# Patient Record
Sex: Female | Born: 1985 | Race: Black or African American | Hispanic: No | Marital: Single | Smoking: Current every day smoker
Health system: Southern US, Community
[De-identification: ages and names within clinical notes are randomized; demographics above are authoritative.]

## PROBLEM LIST (undated history)

## (undated) DIAGNOSIS — J40 Bronchitis, not specified as acute or chronic: Secondary | ICD-10-CM

## (undated) DIAGNOSIS — M329 Systemic lupus erythematosus, unspecified: Secondary | ICD-10-CM

## (undated) DIAGNOSIS — M1990 Unspecified osteoarthritis, unspecified site: Secondary | ICD-10-CM

## (undated) DIAGNOSIS — IMO0002 Reserved for concepts with insufficient information to code with codable children: Secondary | ICD-10-CM

## (undated) HISTORY — PX: TUBAL LIGATION: SHX77

---

## 2010-03-13 ENCOUNTER — Emergency Department (HOSPITAL_BASED_OUTPATIENT_CLINIC_OR_DEPARTMENT_OTHER)
Admission: EM | Admit: 2010-03-13 | Discharge: 2010-03-13 | Disposition: A | Discharge status: Home / Self Care | Payer: 59 | Attending: Emergency Medicine | Admitting: Emergency Medicine

## 2010-03-13 DIAGNOSIS — J069 Acute upper respiratory infection, unspecified: Secondary | ICD-10-CM | POA: Insufficient documentation

## 2010-03-13 DIAGNOSIS — F17200 Nicotine dependence, unspecified, uncomplicated: Secondary | ICD-10-CM | POA: Insufficient documentation

## 2010-12-09 ENCOUNTER — Emergency Department (HOSPITAL_BASED_OUTPATIENT_CLINIC_OR_DEPARTMENT_OTHER)
Admission: EM | Admit: 2010-12-09 | Discharge: 2010-12-09 | Disposition: A | Discharge status: Home / Self Care | Payer: 59 | Attending: Emergency Medicine | Admitting: Emergency Medicine

## 2010-12-09 ENCOUNTER — Encounter: Payer: Self-pay | Admitting: *Deleted

## 2010-12-09 DIAGNOSIS — J40 Bronchitis, not specified as acute or chronic: Secondary | ICD-10-CM

## 2010-12-09 DIAGNOSIS — R059 Cough, unspecified: Secondary | ICD-10-CM | POA: Insufficient documentation

## 2010-12-09 DIAGNOSIS — Z79899 Other long term (current) drug therapy: Secondary | ICD-10-CM | POA: Insufficient documentation

## 2010-12-09 DIAGNOSIS — R05 Cough: Secondary | ICD-10-CM | POA: Insufficient documentation

## 2010-12-09 HISTORY — DX: Bronchitis, not specified as acute or chronic: J40

## 2010-12-09 MED ORDER — ALBUTEROL SULFATE (5 MG/ML) 0.5% IN NEBU
5.0000 mg | INHALATION_SOLUTION | Freq: Once | RESPIRATORY_TRACT | Status: AC
  Administered 2010-12-09: 5 mg via RESPIRATORY_TRACT
  Filled 2010-12-09: qty 1

## 2010-12-09 MED ORDER — PREDNISONE 10 MG PO TABS: 20.0000 mg | ORAL_TABLET | Freq: Every day | ORAL | Status: AC

## 2010-12-09 MED ORDER — ALBUTEROL SULFATE HFA 108 (90 BASE) MCG/ACT IN AERS
2.0000 | INHALATION_SPRAY | Freq: Once | RESPIRATORY_TRACT | Status: AC
  Administered 2010-12-09: 2 via RESPIRATORY_TRACT
  Filled 2010-12-09: qty 6.7

## 2010-12-09 MED ORDER — HYDROCOD POLST-CHLORPHEN POLST 10-8 MG/5ML PO LQCR: 5.0000 mL | Freq: Two times a day (BID) | ORAL | Status: DC | PRN

## 2010-12-09 NOTE — ED Notes (Signed)
Dr. Ghim at bedside. 

## 2010-12-09 NOTE — ED Provider Notes (Signed)
History     CSN: [id number] Arrival date & time: 12/09/2010  8:32 PM   First MD Initiated Contact with Patient 12/09/10 2049      Chief Complaint  Patient presents with  . Cough    (Consider location/radiation/quality/duration/timing/severity/associated sxs/prior treatment) HPI  Past Medical History  Diagnosis Date  . Bronchitis   . Asthma     History reviewed. No pertinent past surgical history.  No family history on file.  History  Substance Use Topics  . Smoking status: Current Everyday Smoker  . Smokeless tobacco: Not on file  . Alcohol Use: Yes     occasional    OB History    Grav Para Term Preterm Abortions TAB SAB Ect Mult Living                  Review of Systems  Allergies  Codeine  Home Medications   Current Outpatient Rx  Name Route Sig Dispense Refill  . DEXTROMETHORPHAN-GUAIFENESIN 10-200 MG PO CAPS Oral Take 2 capsules by mouth every 6 (six) hours as needed. For congestion     . ONE-DAILY MULTI VITAMINS PO TABS Oral Take 1 tablet by mouth daily.      Marland Kitchen HYDROCOD POLST-CHLORPHEN POLST 10-8 MG/5ML PO LQCR Oral Take 5 mLs by mouth every 12 (twelve) hours as needed. 80 mL 0  . PREDNISONE 10 MG PO TABS Oral Take 2 tablets (20 mg total) by mouth daily. 10 tablet 0    BP 149/85  Pulse 96  Temp(Src) 98.1 F (36.7 C) (Oral)  Resp 18  Ht 5\' 5"  (1.651 m)  Wt 170 lb (77.111 kg)  BMI 28.29 kg/m2  SpO2 100%  LMP 11/07/2010  Physical Exam  ED Course  Procedures (including critical care time)  Labs Reviewed - No data to display No results found.   1. Bronchitis       MDM       I personally performed the services described in this documentation, which was scribed in my presence. The recorded information has been reviewed and considered. Kendallyn Lippold Y.    Pt with minimal exp wheeze, paroxysmal cough in the room, not productive, no fever.  CP is reproducible and also worse with coughing which is her primary complaint she admits.   Will treat with tussionex, steroids, inhaled beta agonists for RAD.    Gavin Pound. Oletta Lamas, MD 12/09/10 2134

## 2010-12-09 NOTE — Discharge Instructions (Signed)
Bronchitis Bronchitis is the body's way of reacting to injury and/or infection (inflammation) of the bronchi. Bronchi are the air tubes that extend from the windpipe into the lungs. If the inflammation becomes severe, it may cause shortness of breath. CAUSES  Inflammation may be caused by:  A virus.   Germs (bacteria).   Dust.   Allergens.   Pollutants and many other irritants.  The cells lining the bronchial tree are covered with tiny hairs (cilia). These constantly beat upward, away from the lungs, toward the mouth. This keeps the lungs free of pollutants. When these cells become too irritated and are unable to do their job, mucus begins to develop. This causes the characteristic cough of bronchitis. The cough clears the lungs when the cilia are unable to do their job. Without either of these protective mechanisms, the mucus would settle in the lungs. Then you would develop pneumonia. Smoking is a common cause of bronchitis and can contribute to pneumonia. Stopping this habit is the single most important thing you can do to help yourself. TREATMENT   Your caregiver may prescribe an antibiotic if the cough is caused by bacteria. Also, medicines that open up your airways make it easier to breathe. Your caregiver may also recommend or prescribe an expectorant. It will loosen the mucus to be coughed up. Only take over-the-counter or prescription medicines for pain, discomfort, or fever as directed by your caregiver.   Removing whatever causes the problem (smoking, for example) is critical to preventing the problem from getting worse.   Cough suppressants may be prescribed for relief of cough symptoms.   Inhaled medicines may be prescribed to help with symptoms now and to help prevent problems from returning.   For those with recurrent (chronic) bronchitis, there may be a need for steroid medicines.  SEEK IMMEDIATE MEDICAL CARE IF:   During treatment, you develop more pus-like mucus  (purulent sputum).   You have a fever.   Your baby is older than 3 months with a rectal temperature of 102 F (38.9 C) or higher.   Your baby is 3 months old or younger with a rectal temperature of 100.4 F (38 C) or higher.   You become progressively more ill.   You have increased difficulty breathing, wheezing, or shortness of breath.  It is necessary to seek immediate medical care if you are elderly or sick from any other disease. MAKE SURE YOU:   Understand these instructions.   Will watch your condition.   Will get help right away if you are not doing well or get worse.  Document Released: 01/13/2005 Document Revised: 09/25/2010 Document Reviewed: 11/23/2007 ExitCare Patient Information 2012 [employer]. 

## 2010-12-09 NOTE — ED Notes (Signed)
HA, nasal congestion and coughing x2 weeks. Taking OTC meds without relief. States symptoms are worsening. States her home was recently found to have mold. Pt thinks she may be reacting to mold exposure.

## 2012-03-17 ENCOUNTER — Emergency Department (HOSPITAL_BASED_OUTPATIENT_CLINIC_OR_DEPARTMENT_OTHER)
Admission: EM | Admit: 2012-03-17 | Discharge: 2012-03-18 | Disposition: A | Discharge status: Home / Self Care | Payer: Worker's Compensation | Attending: Emergency Medicine | Admitting: Emergency Medicine

## 2012-03-17 ENCOUNTER — Encounter (HOSPITAL_BASED_OUTPATIENT_CLINIC_OR_DEPARTMENT_OTHER): Payer: Self-pay | Admitting: *Deleted

## 2012-03-17 DIAGNOSIS — S0502XA Injury of conjunctiva and corneal abrasion without foreign body, left eye, initial encounter: Secondary | ICD-10-CM

## 2012-03-17 DIAGNOSIS — S058X9A Other injuries of unspecified eye and orbit, initial encounter: Secondary | ICD-10-CM | POA: Insufficient documentation

## 2012-03-17 DIAGNOSIS — J45909 Unspecified asthma, uncomplicated: Secondary | ICD-10-CM | POA: Insufficient documentation

## 2012-03-17 DIAGNOSIS — Z8709 Personal history of other diseases of the respiratory system: Secondary | ICD-10-CM | POA: Insufficient documentation

## 2012-03-17 DIAGNOSIS — S0500XA Injury of conjunctiva and corneal abrasion without foreign body, unspecified eye, initial encounter: Secondary | ICD-10-CM | POA: Insufficient documentation

## 2012-03-17 DIAGNOSIS — IMO0002 Reserved for concepts with insufficient information to code with codable children: Secondary | ICD-10-CM | POA: Insufficient documentation

## 2012-03-17 DIAGNOSIS — F17200 Nicotine dependence, unspecified, uncomplicated: Secondary | ICD-10-CM | POA: Insufficient documentation

## 2012-03-17 DIAGNOSIS — Y929 Unspecified place or not applicable: Secondary | ICD-10-CM | POA: Insufficient documentation

## 2012-03-17 DIAGNOSIS — Y9389 Activity, other specified: Secondary | ICD-10-CM | POA: Insufficient documentation

## 2012-03-17 MED ORDER — PROPARACAINE HCL 0.5 % OP SOLN
1.0000 [drp] | Freq: Once | OPHTHALMIC | Status: AC
  Administered 2012-03-17: 1 [drp] via OPHTHALMIC
  Filled 2012-03-17: qty 15

## 2012-03-17 MED ORDER — FLUORESCEIN SODIUM 1 MG OP STRP
1.0000 | ORAL_STRIP | Freq: Once | OPHTHALMIC | Status: AC
  Administered 2012-03-17: 1 via OPHTHALMIC
  Filled 2012-03-17: qty 1

## 2012-03-17 NOTE — ED Notes (Signed)
Pt c/o left eye injury at work foreign body in eye

## 2012-03-18 MED ORDER — ERYTHROMYCIN 5 MG/GM OP OINT
TOPICAL_OINTMENT | Freq: Four times a day (QID) | OPHTHALMIC | Status: DC
  Administered 2012-03-18: 01:00:00 via OPHTHALMIC
  Filled 2012-03-18: qty 3.5

## 2012-03-18 NOTE — ED Notes (Signed)
MD at bedside. 

## 2012-03-18 NOTE — ED Provider Notes (Signed)
History     CSN: [id number]  Arrival date & time 03/17/12  2142   First MD Initiated Contact with Patient 03/18/12 0001      Chief Complaint  Patient presents with  . Eye Injury    (Consider location/radiation/quality/duration/timing/severity/associated sxs/prior treatment) HPI Carrie Christensen is a 27 y.o. female who was working this evening at [employer] and pulled something off of the shelves which released some debris into her eye. She had immediate sensation of a foreign body. She went to the bathroom and flush her eyes and had another 2-3 minutes of flushing her eye and an eye wash station. After that her eye felt better she no longer had the sensation of a foreign body although she still has a moderate, scratchy sensation in the inferior aspect of her left eye. She has no visual disturbances. Her vision is normal. Patient does not use contact lenses. Denies any other symptoms this evening, no shortness of breath, chest pain, abdominal pain, nausea vomiting or diarrhea, no headaches, fevers, chills myalgias or arthralgias. No rash.  Past Medical History  Diagnosis Date  . Bronchitis   . Asthma     History reviewed. No pertinent past surgical history.  History reviewed. No pertinent family history.  History  Substance Use Topics  . Smoking status: Current Every Day Smoker    Types: Cigars  . Smokeless tobacco: Not on file  . Alcohol Use: Yes     Comment: occasional    OB History   Grav Para Term Preterm Abortions TAB SAB Ect Mult Living                  Review of Systems At least 10pt or greater review of systems completed and are negative except where specified in the HPI.  Allergies  Codeine  Home Medications   Current Outpatient Rx  Name  Route  Sig  Dispense  Refill  . chlorpheniramine-HYDROcodone (TUSSIONEX PENNKINETIC ER) 10-8 MG/5ML LQCR   Oral   Take 5 mLs by mouth every 12 (twelve) hours as needed.   80 mL   0   . Dextromethorphan-Guaifenesin  (CORICIDIN HBP CONGESTION/COUGH) 10-200 MG CAPS   Oral   Take 2 capsules by mouth every 6 (six) hours as needed. For congestion          . Multiple Vitamin (MULTIVITAMIN) tablet   Oral   Take 1 tablet by mouth daily.             BP 142/84  Pulse 81  Temp(Src) 99.1 F (37.3 C) (Oral)  Resp 16  Ht 5\' 5"  (1.651 m)  Wt 170 lb (77.111 kg)  BMI 28.29 kg/m2  SpO2 100%  LMP 03/04/2012  Physical Exam  Eyes:      Nursing notes reviewed.  Electronic medical record reviewed. VITAL SIGNS:   Filed Vitals:   03/17/12 2149 03/17/12 2150  BP:  142/84  Pulse: 81   Temp: 99.1 F (37.3 C)   TempSrc: Oral   Resp: 16   Height: 5\' 5"  (1.651 m)   Weight: 170 lb (77.111 kg)   SpO2: 100%    CONSTITUTIONAL: Awake, oriented, appears non-toxic HENT: Atraumatic, normocephalic, oral mucosa pink and moist, airway patent. Nares patent without drainage. External ears normal. EYES: Conjunctiva clear in right eye, mildly injected left eye, EOMI, PERRLA. No foreign body seen with lid eversion of left eye upper and lower lids. NECK: Trachea midline, non-tender, supple CARDIOVASCULAR: Normal heart rate, Normal rhythm, No murmurs, rubs, gallops PULMONARY/CHEST:  Clear to auscultation, no rhonchi, wheezes, or rales. Symmetrical breath sounds. Non-tender. ABDOMINAL: Non-distended, soft, non-tender - no rebound or guarding.  BS normal. NEUROLOGIC: Non-focal, moving all four extremities, no gross sensory or motor deficits. EXTREMITIES: No clubbing, cyanosis, or edema SKIN: Warm, Dry, No erythema, No rash. Small pimple left posterior thigh junction with gluteus  ED Course  Procedures (including critical care time)  Labs Reviewed - No data to display No results found.   1. Conjunctival abrasion, left, initial encounter   2. Corneal abrasion, left, initial encounter       MDM  Carrie Christensen is a 27 y.o. female presents with a very small corneal abrasion and slightly larger conjunctival  abrasion of her left eye.  No foreign bodies are seen, patient's sensation of foreign body is completely gone and this happened after she flushed her eye out at work. Lids were everted and no foreign body was seen.  Patient's mild discomfort was relieved by proparacaine.  We'll discharge the patient home with erythromycin topical ophthalmic ointment.  She is urged to return to emergency Department if her eye she turned red for any visual disturbances, any fevers or any other concerning symptoms. Patient's questions have been answered, discharged home stable and in good condition.        Carrie Skene, MD 03/18/12 [phone]

## 2014-02-02 ENCOUNTER — Encounter (HOSPITAL_BASED_OUTPATIENT_CLINIC_OR_DEPARTMENT_OTHER): Payer: Self-pay

## 2014-02-02 ENCOUNTER — Emergency Department (HOSPITAL_BASED_OUTPATIENT_CLINIC_OR_DEPARTMENT_OTHER): Payer: 59

## 2014-02-02 ENCOUNTER — Emergency Department (HOSPITAL_BASED_OUTPATIENT_CLINIC_OR_DEPARTMENT_OTHER)
Admission: EM | Admit: 2014-02-02 | Discharge: 2014-02-03 | Disposition: A | Discharge status: Home / Self Care | Payer: 59 | Attending: Emergency Medicine | Admitting: Emergency Medicine

## 2014-02-02 DIAGNOSIS — J45901 Unspecified asthma with (acute) exacerbation: Secondary | ICD-10-CM | POA: Insufficient documentation

## 2014-02-02 DIAGNOSIS — Z87891 Personal history of nicotine dependence: Secondary | ICD-10-CM | POA: Diagnosis not present

## 2014-02-02 DIAGNOSIS — Z3A01 Less than 8 weeks gestation of pregnancy: Secondary | ICD-10-CM | POA: Diagnosis not present

## 2014-02-02 DIAGNOSIS — O039 Complete or unspecified spontaneous abortion without complication: Secondary | ICD-10-CM | POA: Diagnosis not present

## 2014-02-02 DIAGNOSIS — O4690 Antepartum hemorrhage, unspecified, unspecified trimester: Secondary | ICD-10-CM

## 2014-02-02 DIAGNOSIS — O99511 Diseases of the respiratory system complicating pregnancy, first trimester: Secondary | ICD-10-CM | POA: Diagnosis not present

## 2014-02-02 DIAGNOSIS — O209 Hemorrhage in early pregnancy, unspecified: Secondary | ICD-10-CM | POA: Diagnosis present

## 2014-02-02 DIAGNOSIS — R58 Hemorrhage, not elsewhere classified: Secondary | ICD-10-CM

## 2014-02-02 LAB — HCG, QUANTITATIVE, PREGNANCY: hCG, Beta Chain, Quant, S: 3512 m[IU]/mL — ABNORMAL HIGH (ref <5)

## 2014-02-02 LAB — WET PREP, GENITAL
CLUE CELLS WET PREP: NONE SEEN
TRICH WET PREP: NONE SEEN
WBC WET PREP: NONE SEEN
Yeast Wet Prep HPF POC: NONE SEEN

## 2014-02-02 NOTE — ED Provider Notes (Signed)
On reassessment the patient is awake and alert, interacting appropriately. Patient states that she cannot wait for her test results. She does state that she will follow her obstetrician tomorrow to confirm her Rh status. She was made aware of all risks of leaving prematurely, voiced an understanding of all of these risks, but continued to depart.   Gerhard Munchobert Riddick Nuon, MD 02/02/14 [phone]

## 2014-02-02 NOTE — ED Notes (Signed)
C/o vaginal bleed x 30 min-1 pad in place-pt estimates she is 3 mos pregnant- LMP 10/15-did not keep 12/27 UD appt-G4 P3

## 2014-02-02 NOTE — Discharge Instructions (Signed)
Miscarriage A miscarriage is the sudden loss of an unborn baby (fetus) before the 20th week of pregnancy. Most miscarriages happen in the first 3 months of pregnancy. Sometimes, it happens before a woman even knows she is pregnant. A miscarriage is also called a "spontaneous miscarriage" or "early pregnancy loss." Having a miscarriage can be an emotional experience. Talk with your caregiver about any questions you may have about miscarrying, the grieving process, and your future pregnancy plans. CAUSES   Problems with the fetal chromosomes that make it impossible for the baby to develop normally. Problems with the baby's genes or chromosomes are most often the result of errors that occur, by chance, as the embryo divides and grows. The problems are not inherited from the parents.  Infection of the cervix or uterus.   Hormone problems.   Problems with the cervix, such as having an incompetent cervix. This is when the tissue in the cervix is not strong enough to hold the pregnancy.   Problems with the uterus, such as an abnormally shaped uterus, uterine fibroids, or congenital abnormalities.   Certain medical conditions.   Smoking, drinking alcohol, or taking illegal drugs.   Trauma.  Often, the cause of a miscarriage is unknown.  SYMPTOMS   Vaginal bleeding or spotting, with or without cramps or pain.  Pain or cramping in the abdomen or lower back.  Passing fluid, tissue, or blood clots from the vagina. DIAGNOSIS  Your caregiver will perform a physical exam. You may also have an ultrasound to confirm the miscarriage. Blood or urine tests may also be ordered. TREATMENT   Sometimes, treatment is not necessary if you naturally pass all the fetal tissue that was in the uterus. If some of the fetus or placenta remains in the body (incomplete miscarriage), tissue left behind may become infected and must be removed. Usually, a dilation and curettage (D and C) procedure is performed.  During a D and C procedure, the cervix is widened (dilated) and any remaining fetal or placental tissue is gently removed from the uterus.  Antibiotic medicines are prescribed if there is an infection. Other medicines may be given to reduce the size of the uterus (contract) if there is a lot of bleeding.  If you have Rh negative blood and your baby was Rh positive, you will need a Rh immunoglobulin shot. This shot will protect any future baby from having Rh blood problems in future pregnancies. HOME CARE INSTRUCTIONS   Your caregiver may order bed rest or may allow you to continue light activity. Resume activity as directed by your caregiver.  Have someone help with home and family responsibilities during this time.   Keep track of the number of sanitary pads you use each day and how soaked (saturated) they are. Write down this information.   Do not use tampons. Do not douche or have sexual intercourse until approved by your caregiver.   Only take over-the-counter or prescription medicines for pain or discomfort as directed by your caregiver.   Do not take aspirin. Aspirin can cause bleeding.   Keep all follow-up appointments with your caregiver.   If you or your partner have problems with grieving, talk to your caregiver or seek counseling to help cope with the pregnancy loss. Allow enough time to grieve before trying to get pregnant again.  SEEK IMMEDIATE MEDICAL CARE IF:   You have severe cramps or pain in your back or abdomen.  You have a fever.  You pass large blood clots (walnut-sized   or larger) ortissue from your vagina. Save any tissue for your caregiver to inspect.   Your bleeding increases.   You have a thick, bad-smelling vaginal discharge.  You become lightheaded, weak, or you faint.   You have chills.  MAKE SURE YOU:  Understand these instructions.  Will watch your condition.  Will get help right away if you are not doing well or get  worse. Document Released: 07/09/2000 Document Revised: 05/10/2012 Document Reviewed: 03/04/2011 ExitCare Patient Information 2015 [employer]. This information is not intended to replace advice given to you by your health care provider. Make sure you discuss any questions you have with your health care provider.  

## 2014-02-02 NOTE — ED Provider Notes (Signed)
CSN: [id number]     Arrival date & time 02/02/14  1943 History   First MD Initiated Contact with Patient 02/02/14 2019     Chief Complaint  Patient presents with  . Pregnant   . Vaginal Bleeding     (Consider location/radiation/quality/duration/timing/severity/associated sxs/prior Treatment) Patient is a 29 y.o. female presenting with vaginal bleeding. The history is provided by the patient. No language interpreter was used.  Vaginal Bleeding Quality:  Bright red and clots Severity:  Moderate Onset quality:  Gradual Duration:  1 day Timing:  Constant Progression:  Worsening Chronicity:  New Menstrual history:  Regular Number of pads used:  1 Possible pregnancy: yes   Relieved by:  Nothing Worsened by:  Nothing tried Ineffective treatments:  None tried Risk factors: no bleeding disorder   Pt is early pregnant.  Pt reports she began having bleeding today Pt taking prenatal vitamins.   Pt has not had first prenatal app.  Pt is a [id number]   She is followed by Dr. Delford FieldWright in [place].   Past Medical History  Diagnosis Date  . Bronchitis   . Asthma    History reviewed. No pertinent past surgical history. No family history on file. History  Substance Use Topics  . Smoking status: Former Games developermoker  . Smokeless tobacco: Not on file  . Alcohol Use: No   OB History    Gravida Para Term Preterm AB TAB SAB Ectopic Multiple Living   1              Review of Systems  Genitourinary: Positive for vaginal bleeding.  All other systems reviewed and are negative.     Allergies  Codeine  Home Medications   Prior to Admission medications   Not on File   BP 139/82 mmHg  Pulse 89  Temp(Src) 98.9 F (37.2 C)  Resp 18  Ht 5\' 5"  (1.651 m)  Wt 180 lb (81.647 kg)  BMI 29.95 kg/m2  SpO2 100%  LMP 11/10/2013 Physical Exam  Constitutional: She is oriented to person, place, and time. She appears well-developed and well-nourished.  HENT:  Head: Normocephalic.  Right Ear: External  ear normal.  Left Ear: External ear normal.  Nose: Nose normal.  Mouth/Throat: Oropharynx is clear and moist.  Eyes: EOM are normal.  Neck: Normal range of motion.  Cardiovascular: Normal rate and normal heart sounds.   Pulmonary/Chest: Effort normal.  Abdominal: Soft. She exhibits no distension.  Genitourinary: Uterus normal.  Large clots,   Cervical os closed,    Musculoskeletal: Normal range of motion.  Neurological: She is alert and oriented to person, place, and time.  Skin: Skin is warm.  Psychiatric: She has a normal mood and affect.  Nursing note and vitals reviewed.   ED Course  Procedures (including critical care time) Labs Review Labs Reviewed  HCG, QUANTITATIVE, PREGNANCY - Abnormal; Notable for the following:    hCG, Beta Chain, Quant, S 3512 (*)    All other components within normal limits  WET PREP, GENITAL  GC/CHLAMYDIA PROBE AMP  ABO/RH    Imaging Review [place] Ob Comp Less 14 Wks  02/02/2014   CLINICAL DATA:  Pregnant patient with vaginal bleeding in early pregnancy.  EXAM: OBSTETRIC <14 WK US AND TRANSVAGINAL OB US  TECHNIQUE: Both transabdominal and transvaginal ultrasound examinations were performed for complete evaluation of the gestation as well as the maternal uterus, adnexal regions, and pelvic cul-de-sac. Transvaginal technique was performed to assess early pregnancy.  COMPARISON:  None.  FINDINGS:  Intrauterine gestational sac: Visualized. The gestational sac appears low in the endometrial canal/lower uterine segment and irregular in shape. There is moderate subchorionic hemorrhage.  Yolk sac:  Not present.  Embryo:  Not present.  Cardiac Activity: Not seen.  MSD:  20 mm   6 w   6  d  Maternal uterus/adnexae: The right and left ovary are normal with normal blood flow. There is no adnexal mass. No pelvic free fluid.  IMPRESSION: Intrauterine gestational sac that is irregular in shape and low in position in the endometrial canal/ lower uterine segment. No fetal pole  or yolk sac. Findings are suspicious but not yet definitive for failed pregnancy. Recommend follow-up US in 10-14 days for definitive diagnosis. This recommendation follows SRU consensus guidelines: Diagnostic Criteria for Nonviable Pregnancy Early in the First Trimester. Malva Limes Med 2013; 161:0960-45.   Electronically Signed   By: Rubye Oaks M.D.   On: 02/02/2014 21:55   US Ob Transvaginal  02/02/2014   CLINICAL DATA:  Pregnant patient with vaginal bleeding in early pregnancy.  EXAM: OBSTETRIC <14 WK [place] AND TRANSVAGINAL OB US  TECHNIQUE: Both transabdominal and transvaginal ultrasound examinations were performed for complete evaluation of the gestation as well as the maternal uterus, adnexal regions, and pelvic cul-de-sac. Transvaginal technique was performed to assess early pregnancy.  COMPARISON:  None.  FINDINGS: Intrauterine gestational sac: Visualized. The gestational sac appears low in the endometrial canal/lower uterine segment and irregular in shape. There is moderate subchorionic hemorrhage.  Yolk sac:  Not present.  Embryo:  Not present.  Cardiac Activity: Not seen.  MSD:  20 mm   6 w   6  d  Maternal uterus/adnexae: The right and left ovary are normal with normal blood flow. There is no adnexal mass. No pelvic free fluid.  IMPRESSION: Intrauterine gestational sac that is irregular in shape and low in position in the endometrial canal/ lower uterine segment. No fetal pole or yolk sac. Findings are suspicious but not yet definitive for failed pregnancy. Recommend follow-up US in 10-14 days for definitive diagnosis. This recommendation follows SRU consensus guidelines: Diagnostic Criteria for Nonviable Pregnancy Early in the First Trimester. Malva Limes Med 2013; 409:8119-14.   Electronically Signed   By: Rubye Oaks M.D.   On: 02/02/2014 21:55     EKG Interpretation None      MDM  Impending SAB,  Pt counseled on symptoms.     Final diagnoses:  Bleeding  Spontaneous abortion         Elson Areas, PA-C 02/04/14 1116  Gerhard Munch, MD 02/04/14 2242

## 2014-02-03 LAB — ABO/RH: ABO/RH(D): A POS

## 2014-02-04 LAB — GC/CHLAMYDIA PROBE AMP
CT PROBE, AMP APTIMA: NEGATIVE
GC PROBE AMP APTIMA: NEGATIVE

## 2015-10-13 ENCOUNTER — Emergency Department (HOSPITAL_BASED_OUTPATIENT_CLINIC_OR_DEPARTMENT_OTHER)
Admission: EM | Admit: 2015-10-13 | Discharge: 2015-10-13 | Disposition: A | Discharge status: Home / Self Care | Payer: No Typology Code available for payment source | Attending: Emergency Medicine | Admitting: Emergency Medicine

## 2015-10-13 ENCOUNTER — Encounter (HOSPITAL_BASED_OUTPATIENT_CLINIC_OR_DEPARTMENT_OTHER): Payer: Self-pay | Admitting: Emergency Medicine

## 2015-10-13 ENCOUNTER — Emergency Department (HOSPITAL_BASED_OUTPATIENT_CLINIC_OR_DEPARTMENT_OTHER): Payer: No Typology Code available for payment source

## 2015-10-13 DIAGNOSIS — J45909 Unspecified asthma, uncomplicated: Secondary | ICD-10-CM | POA: Insufficient documentation

## 2015-10-13 DIAGNOSIS — Y999 Unspecified external cause status: Secondary | ICD-10-CM | POA: Diagnosis not present

## 2015-10-13 DIAGNOSIS — F17290 Nicotine dependence, other tobacco product, uncomplicated: Secondary | ICD-10-CM | POA: Insufficient documentation

## 2015-10-13 DIAGNOSIS — Z79899 Other long term (current) drug therapy: Secondary | ICD-10-CM | POA: Diagnosis not present

## 2015-10-13 DIAGNOSIS — M549 Dorsalgia, unspecified: Secondary | ICD-10-CM | POA: Insufficient documentation

## 2015-10-13 DIAGNOSIS — S161XXA Strain of muscle, fascia and tendon at neck level, initial encounter: Secondary | ICD-10-CM | POA: Diagnosis not present

## 2015-10-13 DIAGNOSIS — Y9389 Activity, other specified: Secondary | ICD-10-CM | POA: Insufficient documentation

## 2015-10-13 DIAGNOSIS — Y92410 Unspecified street and highway as the place of occurrence of the external cause: Secondary | ICD-10-CM | POA: Insufficient documentation

## 2015-10-13 DIAGNOSIS — S199XXA Unspecified injury of neck, initial encounter: Secondary | ICD-10-CM | POA: Diagnosis present

## 2015-10-13 HISTORY — DX: Unspecified osteoarthritis, unspecified site: M19.90

## 2015-10-13 NOTE — ED Notes (Signed)
MD at bedside. 

## 2015-10-13 NOTE — ED Notes (Signed)
Pt given d/c instructions as per chart. Verbalizes understanding. No questions. 

## 2015-10-13 NOTE — Discharge Instructions (Signed)
You can take your tramadol as needed for pain. If tramadol does not adequately manage her pain, you can supplement with Tylenol as directed. Contact your primary care physician at [facility] if having significant pain or if pain not well controlled by next week. Return if concerned for any reason.
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
## 2017-12-28 ENCOUNTER — Emergency Department (HOSPITAL_BASED_OUTPATIENT_CLINIC_OR_DEPARTMENT_OTHER)
Admission: EM | Admit: 2017-12-28 | Discharge: 2017-12-28 | Disposition: A | Discharge status: Home / Self Care | Payer: 59 | Attending: Emergency Medicine | Admitting: Emergency Medicine

## 2017-12-28 ENCOUNTER — Emergency Department (HOSPITAL_BASED_OUTPATIENT_CLINIC_OR_DEPARTMENT_OTHER): Payer: 59

## 2017-12-28 ENCOUNTER — Encounter (HOSPITAL_BASED_OUTPATIENT_CLINIC_OR_DEPARTMENT_OTHER): Payer: Self-pay | Admitting: *Deleted

## 2017-12-28 ENCOUNTER — Other Ambulatory Visit: Payer: Self-pay

## 2017-12-28 DIAGNOSIS — Z79899 Other long term (current) drug therapy: Secondary | ICD-10-CM | POA: Diagnosis not present

## 2017-12-28 DIAGNOSIS — J45909 Unspecified asthma, uncomplicated: Secondary | ICD-10-CM | POA: Insufficient documentation

## 2017-12-28 DIAGNOSIS — F17210 Nicotine dependence, cigarettes, uncomplicated: Secondary | ICD-10-CM | POA: Diagnosis not present

## 2017-12-28 DIAGNOSIS — J111 Influenza due to unidentified influenza virus with other respiratory manifestations: Secondary | ICD-10-CM | POA: Diagnosis not present

## 2017-12-28 DIAGNOSIS — R69 Illness, unspecified: Secondary | ICD-10-CM

## 2017-12-28 DIAGNOSIS — R05 Cough: Secondary | ICD-10-CM | POA: Diagnosis present

## 2017-12-28 HISTORY — DX: Reserved for concepts with insufficient information to code with codable children: IMO0002

## 2017-12-28 HISTORY — DX: Systemic lupus erythematosus, unspecified: M32.9

## 2017-12-28 MED ORDER — HYDROCOD POLST-CPM POLST ER 10-8 MG/5ML PO SUER
5.0000 mL | Freq: Once | ORAL | Status: AC
  Administered 2017-12-28: 5 mL via ORAL
  Filled 2017-12-28: qty 5

## 2017-12-28 MED ORDER — BENZONATATE 100 MG PO CAPS: 100.0000 mg | ORAL_CAPSULE | Freq: Three times a day (TID) | ORAL | 0 refills | Status: AC

## 2017-12-28 MED ORDER — NAPROXEN 500 MG PO TABS: 500.0000 mg | ORAL_TABLET | Freq: Two times a day (BID) | ORAL | 0 refills | Status: AC

## 2017-12-28 MED ORDER — KETOROLAC TROMETHAMINE 15 MG/ML IJ SOLN
15.0000 mg | Freq: Once | INTRAMUSCULAR | Status: AC
  Administered 2017-12-28: 15 mg via INTRAMUSCULAR
  Filled 2017-12-28: qty 1

## 2017-12-28 MED ORDER — ALBUTEROL SULFATE HFA 108 (90 BASE) MCG/ACT IN AERS
2.0000 | INHALATION_SPRAY | Freq: Once | RESPIRATORY_TRACT | Status: AC
  Administered 2017-12-28: 2 via RESPIRATORY_TRACT
  Filled 2017-12-28: qty 6.7

## 2017-12-28 NOTE — Discharge Instructions (Signed)
Please read and follow all provided instructions.  Your diagnoses today include:  1. Influenza-like illness     Tests performed today include:  Vital signs. See below for your results today.   Medications prescribed:   Tessalon Perles - cough suppressant medication   Naproxen - anti-inflammatory pain medication  Do not exceed 500mg  naproxen every 12 hours, take with food  You have been prescribed an anti-inflammatory medication or NSAID. Take with food. Take smallest effective dose for the shortest duration needed for your pain. Stop taking if you experience stomach pain or vomiting.    Albuterol inhaler - medication that opens up your airway  Use inhaler as follows: 1-2 puffs every 4 hours as needed for wheezing, cough, or shortness of breath.   Take any prescribed medications only as directed.  Home care instructions:  Follow any educational materials contained in this packet. Please continue drinking plenty of fluids. Use over-the-counter cold and flu medications as needed as directed on packaging for symptom relief. You may also use ibuprofen or tylenol as directed on packaging for pain or fever.   BE VERY CAREFUL not to take multiple medicines containing Tylenol (also called acetaminophen). Doing so can lead to an overdose which can damage your liver and cause liver failure and possibly death.   Follow-up instructions: Please follow-up with your primary care provider in the next 3 days for further evaluation of your symptoms.   Return instructions:   Please return to the Emergency Department if you experience worsening symptoms.  Please return if you have a high fever greater than 101 degrees not controlled with over-the-counter medications, persistent vomiting and cannot keep down fluids, or worsening trouble breathing.  Please return if you have any other emergent concerns.  Additional Information:  Your vital signs today were: BP (!) 123/105    Pulse (!) 120     Temp 99.9 F (37.7 C) (Oral)    Resp 20    Ht 5\' 5"  (1.651 m)    Wt 81.6 kg    LMP 12/07/2017    SpO2 97%    BMI 29.95 kg/m  If your blood pressure (BP) was elevated above 135/85 this visit, please have this repeated by your doctor within one month.

## 2017-12-28 NOTE — ED Provider Notes (Signed)
[facility] EMERGENCY DEPARTMENT Provider Note   CSN: [id number] Arrival date & time: 12/28/17  1713     History   Chief Complaint Chief Complaint  Patient presents with  . Cough    HPI Carrie Christensen is a 32 y.o. female.  Patient presents to the emergency department today with 2 days of flulike symptoms.  She has had a nonproductive cough, fever to 100.5 F at home, body aches, sore throat, nasal congestion and runny nose.  She has pain throughout her chest when she coughs.  She has been using over-the-counter medications with mixed results.  She denies any nausea, vomiting, or diarrhea.  No known sick contacts.  History of asthma but she has not been wheezing.  States that she is currently out of her inhaler.     Past Medical History:  Diagnosis Date  . Arthritis   . Asthma   . Bronchitis   . Lupus (HCC)     There are no active problems to display for this patient.   Past Surgical History:  Procedure Laterality Date  . TUBAL LIGATION       OB History    Gravida  1   Para      Term      Preterm      AB      Living        SAB      TAB      Ectopic      Multiple      Live Births               Home Medications    Prior to Admission medications   Medication Sig Start Date End Date Taking? Authorizing Provider  GABAPENTIN PO Take by mouth.   Yes [provider]  benzonatate (TESSALON) 100 MG capsule Take 1 capsule (100 mg total) by mouth every 8 (eight) hours. 12/28/17   Renne CriglerGeiple, Keyanni Whittinghill, PA-C  esomeprazole (NEXIUM) 20 MG capsule Take 20 mg by mouth daily at 12 noon.    [provider]  naproxen (NAPROSYN) 500 MG tablet Take 1 tablet (500 mg total) by mouth 2 (two) times daily. 12/28/17   Renne CriglerGeiple, Le[place] Phifer, PA-C  ondansetron (ZOFRAN) 4 MG tablet Take 4 mg by mouth every 8 (eight) hours as needed for nausea or vomiting.    [provider]    Family History No family history on file.  Social History Social  History   Tobacco Use  . Smoking status: Current Every Day Smoker    Types: Cigars  . Smokeless tobacco: Never Used  Substance Use Topics  . Alcohol use: No  . Drug use: No     Allergies   Codeine   Review of Systems Review of Systems  Constitutional: Positive for chills, fatigue and fever.  HENT: Positive for congestion and sore throat. Negative for ear pain, rhinorrhea and sinus pressure.   Eyes: Negative for redness.  Respiratory: Positive for cough. Negative for shortness of breath and wheezing.   Gastrointestinal: Negative for abdominal pain, diarrhea, nausea and vomiting.  Genitourinary: Negative for dysuria.  Musculoskeletal: Positive for myalgias. Negative for neck stiffness.  Skin: Negative for rash.  Neurological: Negative for headaches.  Hematological: Negative for adenopathy.     Physical Exam Updated Vital Signs BP (!) 147/93 (BP Location: Right Arm)   Pulse (!) 101   Temp 100.1 F (37.8 C) (Oral)   Resp 20   Ht 5\' 5"  (1.651 m)   Wt 81.6  kg   LMP 12/07/2017   SpO2 100%   BMI 29.95 kg/m   Physical Exam  Constitutional: She appears well-developed and well-nourished.  HENT:  Head: Normocephalic and atraumatic.  Right Ear: Tympanic membrane, external ear and ear canal normal.  Left Ear: Tympanic membrane, external ear and ear canal normal.  Nose: Mucosal edema present. No rhinorrhea.  Mouth/Throat: Uvula is midline, oropharynx is clear and moist and mucous membranes are normal. Mucous membranes are not dry. No oral lesions. No trismus in the jaw. No uvula swelling. No oropharyngeal exudate, posterior oropharyngeal edema, posterior oropharyngeal erythema or tonsillar abscesses.  Eyes: Conjunctivae are normal. Right eye exhibits no discharge. Left eye exhibits no discharge.  Neck: Normal range of motion. Neck supple.  Cardiovascular: Regular rhythm and normal heart sounds. Tachycardia present.  Mild tachy  Pulmonary/Chest: Effort normal and breath  sounds normal. No respiratory distress. She has no wheezes. She has no rales.  Active cough during exam  Abdominal: Soft. There is no tenderness.  Lymphadenopathy:    She has no cervical adenopathy.  Neurological: She is alert.  Skin: Skin is warm and dry.  Psychiatric: She has a normal mood and affect.  Nursing note and vitals reviewed.    ED Treatments / Results  Labs (all labs ordered are listed, but only abnormal results are displayed) Labs Reviewed - No data to display  EKG None  Radiology Dg Chest 2 View  Result Date: 12/28/2017 CLINICAL DATA:  Cough, fever and body aches EXAM: CHEST - 2 VIEW COMPARISON:  03/19/2017 FINDINGS: The heart size and mediastinal contours are within normal limits. Both lungs are clear. The visualized skeletal structures are unremarkable. IMPRESSION: No active cardiopulmonary disease. Electronically Signed   By: Signa Kell M.D.   On: 12/28/2017 18:35    Procedures Procedures (including critical care time)  Medications Ordered in ED Medications  chlorpheniramine-HYDROcodone (TUSSIONEX) 10-8 MG/5ML suspension 5 mL (has no administration in time range)  ketorolac (TORADOL) 15 MG/ML injection 15 mg (has no administration in time range)  albuterol (PROVENTIL HFA;VENTOLIN HFA) 108 (90 Base) MCG/ACT inhaler 2 puff (has no administration in time range)     Initial Impression / Assessment and Plan / ED Course  I have reviewed the triage vital signs and the nursing notes.  Pertinent labs & imaging results that were available during my care of the patient were reviewed by me and considered in my medical decision making (see chart for details).     Patient seen and examined. Work-up initiated. Medications ordered.   Vital signs reviewed and are as follows: BP (!) 147/93 (BP Location: Right Arm)   Pulse (!) 101   Temp 100.1 F (37.8 C) (Oral)   Resp 20   Ht 5\' 5"  (1.651 m)   Wt 81.6 kg   LMP 12/07/2017   SpO2 100%   BMI 29.95 kg/m   We  will treat here with IM Toradol, dose of Tussionex, albuterol inhaler.  Patient discharged to home. Encouraged to rest and drink plenty of fluids.  Patient told to return to ED or see their primary doctor if their symptoms worsen, high fever not controlled with tylenol, persistent vomiting, they feel they are dehydrated, or if they have any other concerns.  Patient verbalized understanding and agreed with plan.     Final Clinical Impressions(s) / ED Diagnoses   Final diagnoses:  Influenza-like illness   Patient with symptoms consistent with influenza. Vitals are stable, low-grade fever. No signs of dehydration, tolerating PO's. Lungs  are clear. Supportive therapy indicated with return if symptoms worsen. Patient counseled.   ED Discharge Orders         Ordered    benzonatate (TESSALON) 100 MG capsule  Every 8 hours     12/28/17 1911    naproxen (NAPROSYN) 500 MG tablet  2 times daily     12/28/17 1911           Renne Crigler, Cordelia Poche 12/28/17 Larence Penning, MD 12/31/17 [phone]

## 2017-12-28 NOTE — ED Triage Notes (Signed)
Cough, fever, body aches x2 days

## 2019-08-09 IMAGING — DX DG CHEST 2V
2 series · 2 of 2 positions shown · non-contrast
Comparison: 03/19/2017

CLINICAL DATA: Cough, fever and body aches

EXAM:
CHEST - 2 VIEW

[chest pa]
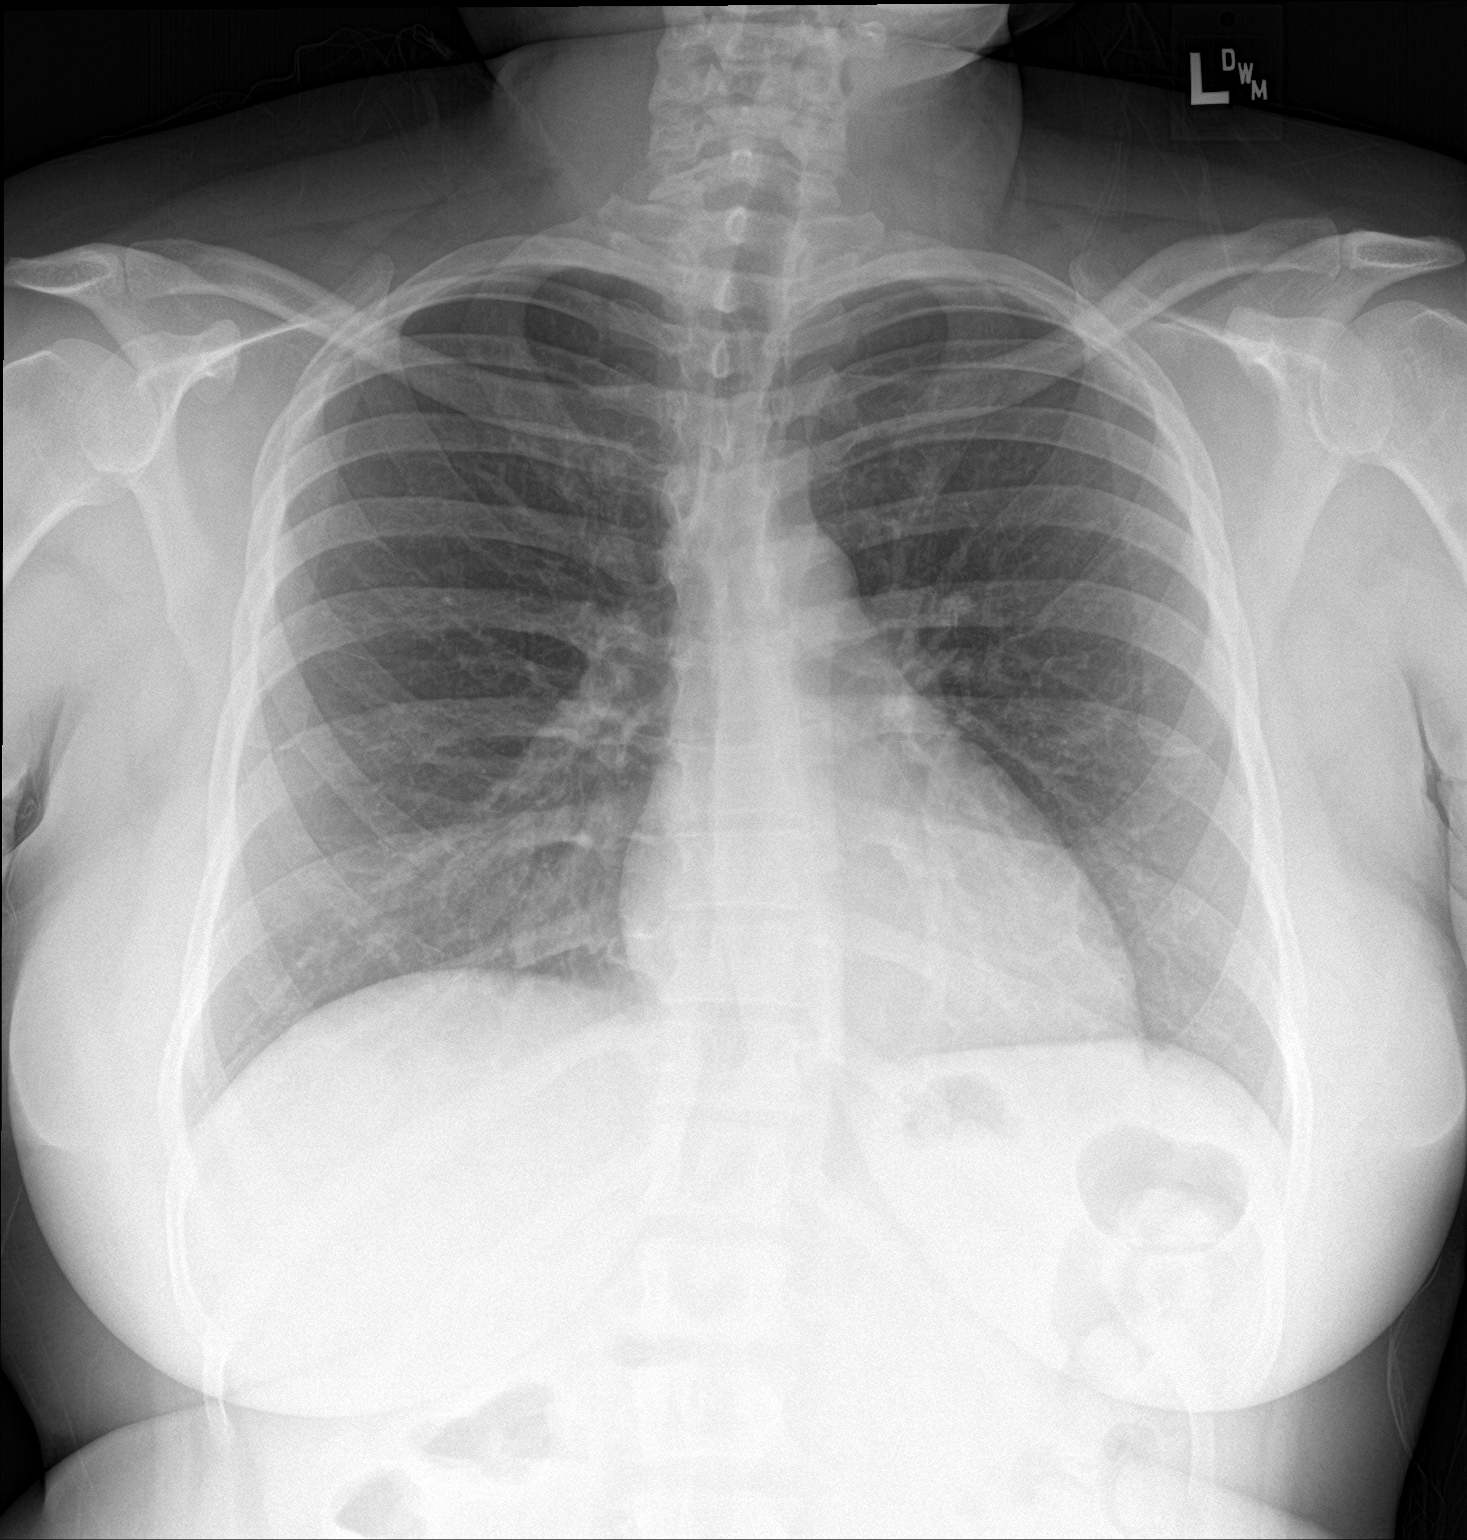

[chest lat]
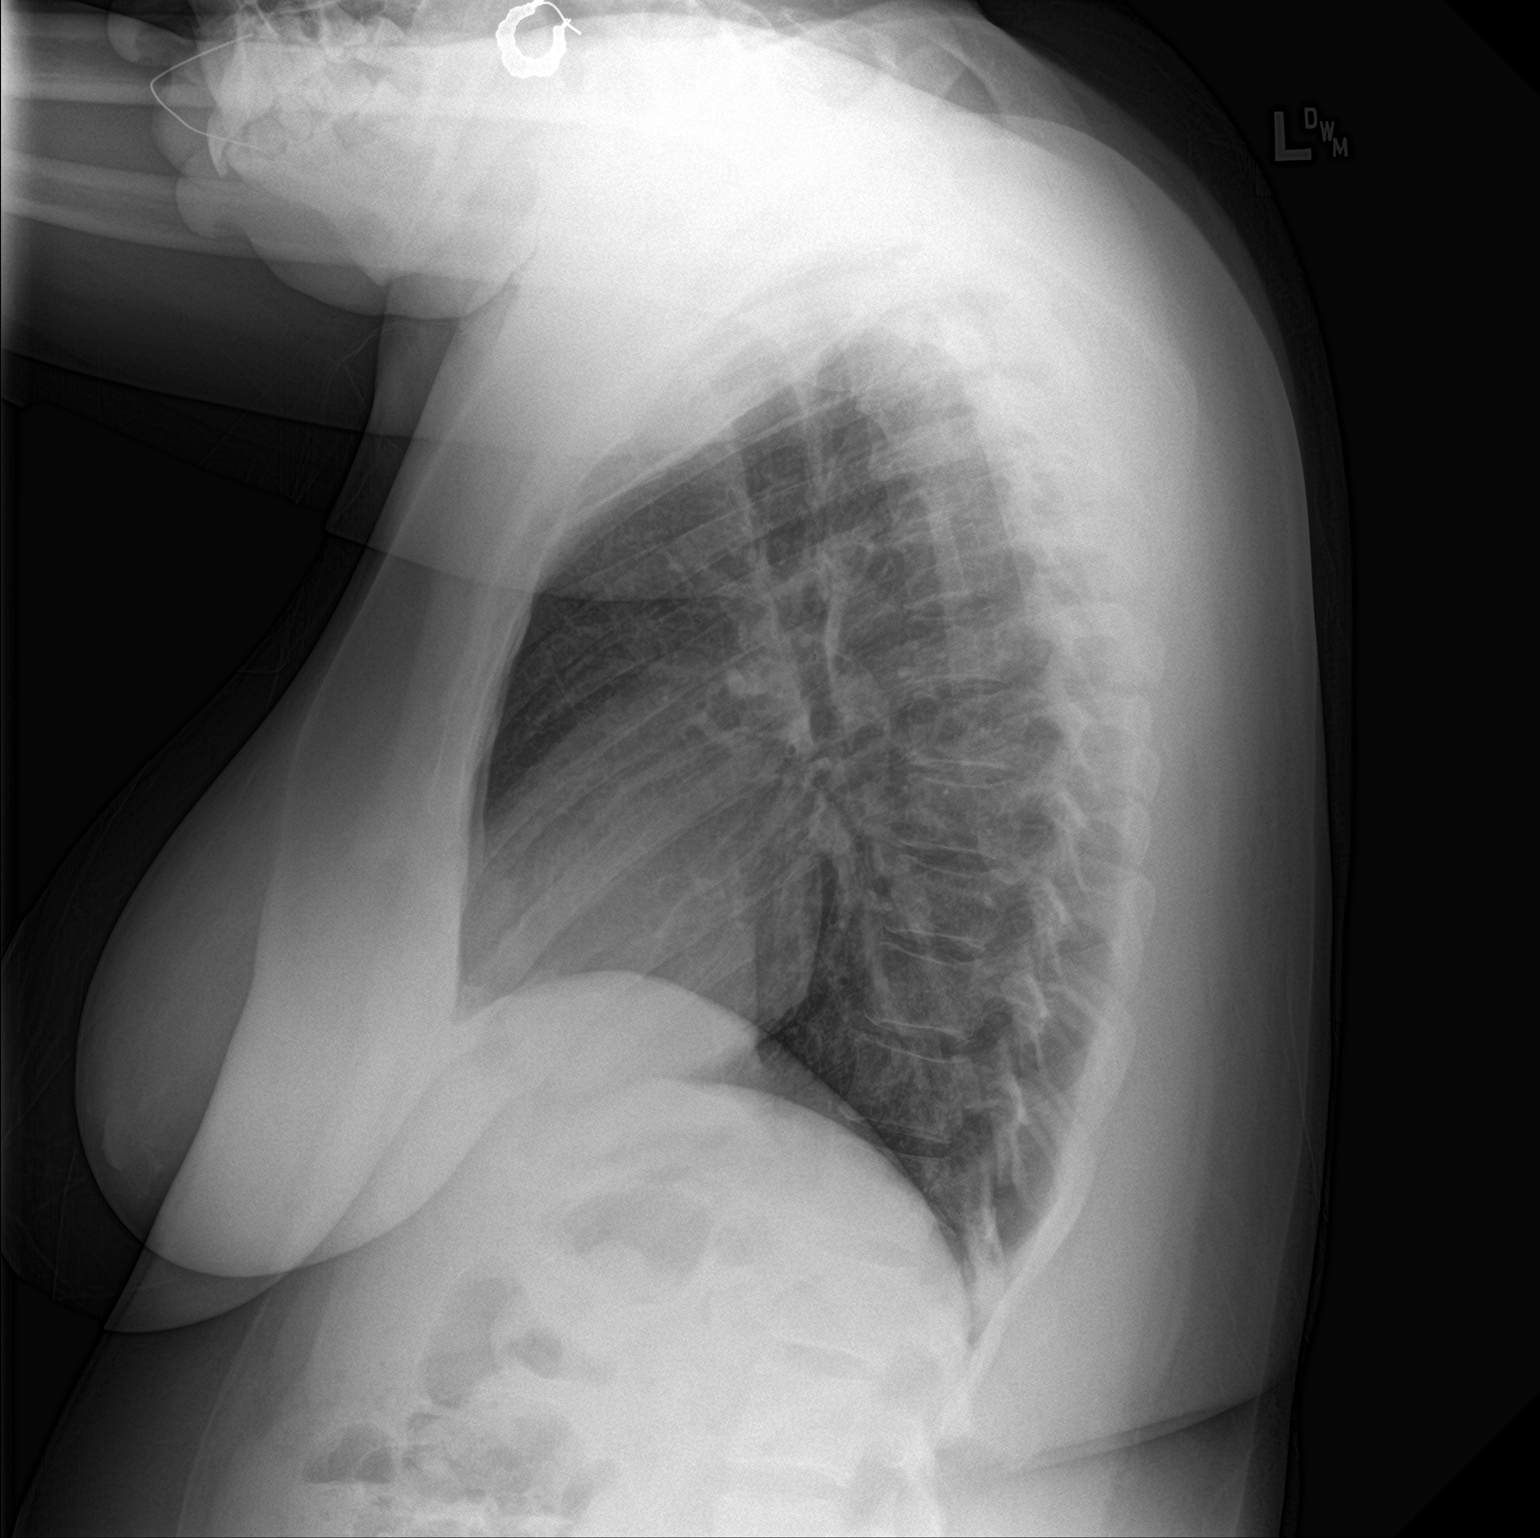

[2 of 2 positions shown; findings below may reference images not displayed]

FINDINGS: The heart size and mediastinal contours are within normal limits.
Both lungs are clear. The visualized skeletal structures are
unremarkable.
IMPRESSION: No active cardiopulmonary disease.
